# Patient Record
Sex: Female | Born: 1980 | Race: Black or African American | Hispanic: No | Marital: Single | Smoking: Never smoker
Health system: Southern US, Community
[De-identification: ages and names within clinical notes are randomized; demographics above are authoritative.]

## PROBLEM LIST (undated history)

## (undated) DIAGNOSIS — F329 Major depressive disorder, single episode, unspecified: Secondary | ICD-10-CM

## (undated) DIAGNOSIS — F32A Depression, unspecified: Secondary | ICD-10-CM

## (undated) DIAGNOSIS — F319 Bipolar disorder, unspecified: Secondary | ICD-10-CM

## (undated) DIAGNOSIS — E079 Disorder of thyroid, unspecified: Secondary | ICD-10-CM

## (undated) DIAGNOSIS — IMO0002 Reserved for concepts with insufficient information to code with codable children: Secondary | ICD-10-CM

---

## 2011-11-13 ENCOUNTER — Emergency Department (HOSPITAL_COMMUNITY)
Admission: EM | Admit: 2011-11-13 | Discharge: 2011-11-13 | Disposition: A | Discharge status: Home / Self Care | Payer: Medicare Other | Attending: Emergency Medicine | Admitting: Emergency Medicine

## 2011-11-13 ENCOUNTER — Emergency Department (HOSPITAL_COMMUNITY): Payer: Medicare Other

## 2011-11-13 ENCOUNTER — Encounter (HOSPITAL_COMMUNITY): Payer: Self-pay

## 2011-11-13 DIAGNOSIS — E079 Disorder of thyroid, unspecified: Secondary | ICD-10-CM | POA: Insufficient documentation

## 2011-11-13 DIAGNOSIS — Z79899 Other long term (current) drug therapy: Secondary | ICD-10-CM | POA: Insufficient documentation

## 2011-11-13 DIAGNOSIS — R202 Paresthesia of skin: Secondary | ICD-10-CM

## 2011-11-13 DIAGNOSIS — F3289 Other specified depressive episodes: Secondary | ICD-10-CM | POA: Insufficient documentation

## 2011-11-13 DIAGNOSIS — F329 Major depressive disorder, single episode, unspecified: Secondary | ICD-10-CM | POA: Insufficient documentation

## 2011-11-13 DIAGNOSIS — R209 Unspecified disturbances of skin sensation: Secondary | ICD-10-CM | POA: Insufficient documentation

## 2011-11-13 DIAGNOSIS — R51 Headache: Secondary | ICD-10-CM

## 2011-11-13 DIAGNOSIS — F319 Bipolar disorder, unspecified: Secondary | ICD-10-CM | POA: Insufficient documentation

## 2011-11-13 HISTORY — DX: Major depressive disorder, single episode, unspecified: F32.9

## 2011-11-13 HISTORY — DX: Depression, unspecified: F32.A

## 2011-11-13 HISTORY — DX: Bipolar disorder, unspecified: F31.9

## 2011-11-13 HISTORY — DX: Disorder of thyroid, unspecified: E07.9

## 2011-11-13 LAB — CBC
HCT: 34.6 % — ABNORMAL LOW (ref 36.0–46.0)
Hemoglobin: 11.6 g/dL — ABNORMAL LOW (ref 12.0–15.0)
MCH: 29.8 pg (ref 26.0–34.0)
MCHC: 33.5 g/dL (ref 30.0–36.0)
MCV: 88.9 fL (ref 78.0–100.0)
RBC: 3.89 MIL/uL (ref 3.87–5.11)

## 2011-11-13 LAB — COMPREHENSIVE METABOLIC PANEL
ALT: 12 U/L (ref 0–35)
AST: 17 U/L (ref 0–37)
CO2: 25 mEq/L (ref 19–32)
Chloride: 100 mEq/L (ref 96–112)
Creatinine, Ser: 0.86 mg/dL (ref 0.50–1.10)
GFR calc Af Amer: >90 mL/min (ref >90)
GFR calc non Af Amer: 90 mL/min — ABNORMAL LOW (ref >90)
Glucose, Bld: 97 mg/dL (ref 70–99)
Total Bilirubin: 0.3 mg/dL (ref 0.3–1.2)

## 2011-11-13 LAB — PREGNANCY, URINE: Preg Test, Ur: NEGATIVE

## 2011-11-13 MED ORDER — TRAMADOL HCL 50 MG PO TABS: 50.0000 mg | ORAL_TABLET | Freq: Four times a day (QID) | ORAL | Status: DC | PRN

## 2011-11-13 NOTE — ED Notes (Signed)
Dr. Steinl at bedside 

## 2011-11-13 NOTE — ED Provider Notes (Signed)
History     CSN: [id number]  Arrival date & time 11/13/11  0802   First MD Initiated Contact with Patient 11/13/11 [phone]      Chief Complaint  Patient presents with  . Numbness    (Consider location/radiation/quality/duration/timing/severity/associated sxs/prior treatment) The history is provided by the patient.  pt c/o numbness/tingling bil feet intermittently for past 1-2 weeks. Also states intermittent frontal headache in past 1-2 weeks. Daily headache. Gradual onset. Not related to certain time of day or activity. No change whether upright or supine. Dull. Frontal. Mild-moderate. No eye pain or change in vision. No numbness/weakness. No neck pain or stiffness. Denies sinus congestion or drainage. No uri c/o. No recent head injury or fall. No problems w balance or coordination. No unilateral numbness or weakness. Denies recent change in meds x hctz. States saw pcp w same, and had her thyroid tests rechecked in past week - was told normal, that she was on the appropriate dose of her synthroid.     Past Medical History  Diagnosis Date  . Thyroid disease   . Depression   . Bipolar 1 disorder     History reviewed. No pertinent past surgical history.  No family history on file.  History  Substance Use Topics  . Smoking status: Never Smoker   . Smokeless tobacco: Never Used  . Alcohol Use: No    OB History    Grav Para Term Preterm Abortions TAB SAB Ect Mult Living                  Review of Systems  Constitutional: Negative for fever and chills.  HENT: Negative for congestion, rhinorrhea, neck pain and neck stiffness.   Eyes: Negative for pain and visual disturbance.  Respiratory: Negative for cough and shortness of breath.   Cardiovascular: Negative for chest pain.  Gastrointestinal: Negative for vomiting, abdominal pain and diarrhea.  Genitourinary: Negative for dysuria and flank pain.  Musculoskeletal: Negative for back pain.  Skin: Negative for rash.    Neurological: Positive for headaches.  Hematological: Does not bruise/bleed easily.  Psychiatric/Behavioral: Negative for confusion.    Allergies  Review of patient's allergies indicates no known allergies.  Home Medications   Current Outpatient Rx  Name Route Sig Dispense Refill  . ARIPIPRAZOLE 10 MG PO TABS Oral Take 5 mg by mouth daily.    Marland Kitchen DIVALPROEX SODIUM ER 250 MG PO TB24 Oral Take 1,250 mg by mouth daily.    Marland Kitchen HYDROCHLOROTHIAZIDE 12.5 MG PO CAPS Oral Take 12.5 mg by mouth daily.    . IBUPROFEN 200 MG PO TABS Oral Take 400 mg by mouth every 8 (eight) hours as needed. For pain.    Marland Kitchen LEVOTHYROXINE SODIUM 75 MCG PO TABS Oral Take 75 mcg by mouth daily.    . NORELGESTROMIN-ETH ESTRADIOL 150-20 MCG/24HR TD PTWK Transdermal Place 1 patch onto the skin once a week. Switched out on Saturdays.      BP 140/84  Pulse 95  Temp 97.8 F (36.6 C) (Oral)  Resp 18  SpO2 100%  LMP 10/29/2011  Physical Exam  Nursing note and vitals reviewed. Constitutional: She is oriented to person, place, and time. She appears well-developed and well-nourished. No distress.  HENT:  Head: Atraumatic.  Nose: Nose normal.  Mouth/Throat: Oropharynx is clear and moist.       No sinus or temporal tenderness.  Eyes: Conjunctivae normal and EOM are normal. Pupils are equal, round, and reactive to light. No scleral icterus.  Fundus exam unremarkable.   Neck: Neck supple. No tracheal deviation present. No thyromegaly present.       No stiffness or rigidity.   Cardiovascular: Normal rate, regular rhythm, normal heart sounds and intact distal pulses.  Exam reveals no gallop and no friction rub.   No murmur heard. Pulmonary/Chest: Effort normal and breath sounds normal. No respiratory distress.  Abdominal: Soft. Normal appearance and bowel sounds are normal. She exhibits no distension. There is no tenderness.  Genitourinary:       No cva tenderness.  Musculoskeletal: Normal range of motion. She  exhibits no edema and no tenderness.  Neurological: She is alert and oriented to person, place, and time. No cranial nerve deficit.       Motor intact bilaterally. Steady gait.   Skin: Skin is warm and dry. No rash noted. She is not diaphoretic.  Psychiatric: She has a normal mood and affect.    ED Course  Procedures (including critical care time)   Labs Reviewed  CBC  BASIC METABOLIC PANEL  PREGNANCY, URINE   Results for orders placed during the hospital encounter of 11/13/11  CBC      Component Value Range   WBC 8.3  4.0 - 10.5 K/uL   RBC 3.89  3.87 - 5.11 MIL/uL   Hemoglobin 11.6 (*) 12.0 - 15.0 g/dL   HCT 16.1 (*) 09.6 - 04.5 %   MCV 88.9  78.0 - 100.0 fL   MCH 29.8  26.0 - 34.0 pg   MCHC 33.5  30.0 - 36.0 g/dL   RDW 40.9  81.1 - 91.4 %   Platelets 324  150 - 400 K/uL  PREGNANCY, URINE      Component Value Range   Preg Test, Ur NEGATIVE  NEGATIVE  COMPREHENSIVE METABOLIC PANEL      Component Value Range   Sodium 135  135 - 145 mEq/L   Potassium 4.2  3.5 - 5.1 mEq/L   Chloride 100  96 - 112 mEq/L   CO2 25  19 - 32 mEq/L   Glucose, Bld 97  70 - 99 mg/dL   BUN 6  6 - 23 mg/dL   Creatinine, Ser 7.82  0.50 - 1.10 mg/dL   Calcium 9.3  8.4 - 95.6 mg/dL   Total Protein 6.7  6.0 - 8.3 g/dL   Albumin 2.9 (*) 3.5 - 5.2 g/dL   AST 17  0 - 37 U/L   ALT 12  0 - 35 U/L   Alkaline Phosphatase 48  39 - 117 U/L   Total Bilirubin 0.3  0.3 - 1.2 mg/dL   GFR calc non Af Amer 90 (*) >90 mL/min   GFR calc Af Amer >90  >90 mL/min   Ct Head Wo Contrast  11/13/2011  *RADIOLOGY REPORT*  Clinical Data: Headache.  CT HEAD WITHOUT CONTRAST  Technique:  Contiguous axial images were obtained from the base of the skull through the vertex without contrast.  Comparison: None.  Findings: Bony calvarium is intact.  No mass effect or midline shift is noted.  Ventricular size is within normal limits.  There is no evidence of mass lesion, hemorrhage or acute infarction.  IMPRESSION: No gross  intracranial abnormality seen.   Original Report Authenticated By: Venita Sheffield., M.D.       MDM  Labs. Ct.   Discussed labs w pt. Ct neg acute.  Will give rx for home for headache. Pt states pcp has set up neurology eval/f/u in the next  1-2 weeks, encouraged to keep pcp and neuro follow up.         Suzi Roots, MD 11/13/11 [phone]

## 2011-11-13 NOTE — ED Notes (Signed)
Patient transported to CT 

## 2011-11-13 NOTE — ED Notes (Signed)
Patient c/o having numbness of bilateral feet and a frontal headache. Right> left. Patient reports that she was seen by her PCP and did not find anything wrong.

## 2016-03-15 ENCOUNTER — Emergency Department (HOSPITAL_COMMUNITY)
Admission: EM | Admit: 2016-03-15 | Discharge: 2016-03-15 | Disposition: A | Discharge status: Home / Self Care | Payer: Medicare Other | Attending: Emergency Medicine | Admitting: Emergency Medicine

## 2016-03-15 ENCOUNTER — Encounter (HOSPITAL_COMMUNITY): Payer: Self-pay | Admitting: Emergency Medicine

## 2016-03-15 DIAGNOSIS — Z79899 Other long term (current) drug therapy: Secondary | ICD-10-CM | POA: Insufficient documentation

## 2016-03-15 DIAGNOSIS — G894 Chronic pain syndrome: Secondary | ICD-10-CM

## 2016-03-15 DIAGNOSIS — M25572 Pain in left ankle and joints of left foot: Secondary | ICD-10-CM | POA: Diagnosis present

## 2016-03-15 MED ORDER — HYDROMORPHONE HCL 1 MG/ML IJ SOLN: 2.0000 mg | Freq: Once | INTRAMUSCULAR | Status: DC

## 2016-03-15 MED ORDER — KETOROLAC TROMETHAMINE 60 MG/2ML IM SOLN
60.0000 mg | Freq: Once | INTRAMUSCULAR | Status: AC
  Administered 2016-03-15: 60 mg via INTRAMUSCULAR
  Filled 2016-03-15: qty 2

## 2016-03-15 NOTE — ED Triage Notes (Signed)
Pt c/o generalized burning, "on fire all over," bilateral foot pain, burning, and swelling, right foot throbbing, SOB worse when laying. Edema to feet has been progressive over past 7 months. Generalized pruritus. Palms of hands are red and dry. All symptoms started in August after stubbing and breaking left toes. 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 Also diagnosed with influenza on February 9th, had initially felt better but now hoarse and coughing, feels "cold inside." No aching. Bilateral lower extremities with marked nonpitting edema.

## 2016-03-15 NOTE — ED Provider Notes (Signed)
[facility]-EMERGENCY DEPT Provider Note   CSN: [id number] Arrival date & time: 03/15/16  1006     History   Chief Complaint Chief Complaint  Patient presents with  . Leg Swelling    multiple complaints  . Foot Pain    HPI Sophia Shepard is a 36 y.o. female.  HPI Patient reports that she injured her left foot in August 2017 breaking to toes.  She states since then she's had chronic ongoing or worsening generalized pain to the left foot and left ankle.  She states it feels like fire.  She has been diagnosed with complex regional pain syndrome.  She states that the medications prescribed by both her chronic pain specialist and by the neurologist are not helping.  She no longer has a relationship with the pain specialist.  She's never been prescribed opiate medications.  She states that she is been on Neurontin and is taking this without improvement in her symptoms.  She states she does not know what else to do and does not know where else to turn.  She is requesting assistance with pain control of her left foot and leg.  Noted fevers or chills.  No new swelling.  No erythema.   Past Medical History:  Diagnosis Date  . Bipolar 1 disorder (HCC)   . Depression   . Thyroid disease     There are no active problems to display for this patient.   History reviewed. No pertinent surgical history.  OB History    No data available       Home Medications    Prior to Admission medications   Medication Sig Start Date End Date Taking? Authorizing Provider  acetaminophen (TYLENOL) 500 MG tablet Take 500-1,000 mg by mouth every 4 (four) hours as needed for mild pain, moderate pain, fever or headache.   Yes Historical Provider, MD  Cyanocobalamin (VITAMIN B12 PO) Take 5,000 mcg by mouth every Saturday.   Yes Historical Provider, MD  divalproex (DEPAKOTE ER) 250 MG 24 hr tablet Take 1,000 mg by mouth at bedtime. *Brand Name*   Yes Historical Provider, MD  ibuprofen (ADVIL,MOTRIN) 200 MG tablet  Take 400 mg by mouth every 4 (four) hours as needed for fever, headache, mild pain, moderate pain or cramping. For pain.    Yes Historical Provider, MD  levothyroxine (SYNTHROID, LEVOTHROID) 75 MCG tablet Take 75 mcg by mouth daily before breakfast.    Yes Historical Provider, MD  norelgestromin-ethinyl estradiol Burr Medico(XULANE) 150-35 MCG/24HR transdermal patch Place 1 patch onto the skin every Saturday. Puts on once a weekly every week of month except 3rd week is off   Yes Historical Provider, MD  Vitamin D, Ergocalciferol, (DRISDOL) 50000 units CAPS capsule Take 1 capsule by mouth every Thursday. 01/19/16  Yes Historical Provider, MD    Family History History reviewed. No pertinent family history.  Social History Social History  Substance Use Topics  . Smoking status: Never Smoker  . Smokeless tobacco: Never Used  . Alcohol use No     Allergies   Benadryl [diphenhydramine hcl (sleep)]; Gabapentin; and Topiramate   Review of Systems Review of Systems  All other systems reviewed and are negative.    Physical Exam Updated Vital Signs BP 128/90 (BP Location: Left Arm)   Pulse 85   Temp 98.2 F (36.8 C) (Oral)   Resp 18   SpO2 99%   Physical Exam  Constitutional: She is oriented to person, place, and time. She appears well-developed and well-nourished.  HENT:  Head: Normocephalic.  Eyes: EOM are normal.  Neck: Normal range of motion.  Pulmonary/Chest: Effort normal.  Abdominal: She exhibits no distension.  Musculoskeletal: Normal range of motion.  Normal pulses in left foot normal range of motion left ankle.  Left foot is perfused.  She wiggles her toes bilaterally.  +1 edema bilaterally.  No unilateral leg swelling as compared to the other  Neurological: She is alert and oriented to person, place, and time.  Psychiatric: She has a normal mood and affect.  Nursing note and vitals reviewed.    ED Treatments / Results  Labs (all labs ordered are listed, but only abnormal  results are displayed) Labs Reviewed - No data to display  EKG  EKG Interpretation None       Radiology No results found.  Procedures Procedures (including critical care time)  Medications Ordered in ED Medications  HYDROmorphone (DILAUDID) injection 2 mg (2 mg Intramuscular Refused 03/15/16 1235)  ketorolac (TORADOL) injection 60 mg (60 mg Intramuscular Given 03/15/16 1159)     Initial Impression / Assessment and Plan / ED Course  I have reviewed the triage vital signs and the nursing notes.  Pertinent labs & imaging results that were available during my care of the patient were reviewed by me and considered in my medical decision making (see chart for details).     The patient has a history consistent with complex regional pain syndrome.  I offered her opiate medication here in emergency department to control her pain but she does not want this.  I do not have any other additional recommendations at this time except to follow-up with her chronic pain specialist.  With a long discussion regarding the benefits and expertise of a pain specialist in this type of illness.  All questions answered.  Primary care, neurology, pain clinic follow-up.  No limb or  life-threatening illness present  Final Clinical Impressions(s) / ED Diagnoses   Final diagnoses:  Chronic pain syndrome    New Prescriptions New Prescriptions   No medications on file     Azalia Bilis, MD 03/15/16 1316

## 2016-03-15 NOTE — Discharge Instructions (Signed)
Please call a chronic pain specialist for further evaluation and management of your chronic pain syndrome

## 2016-07-01 ENCOUNTER — Emergency Department (INDEPENDENT_AMBULATORY_CARE_PROVIDER_SITE_OTHER)
Admission: EM | Admit: 2016-07-01 | Discharge: 2016-07-01 | Disposition: A | Discharge status: Home / Self Care | Payer: Medicare Other | Source: Home / Self Care | Attending: Family Medicine | Admitting: Family Medicine

## 2016-07-01 ENCOUNTER — Emergency Department (HOSPITAL_BASED_OUTPATIENT_CLINIC_OR_DEPARTMENT_OTHER): Payer: Medicare Other

## 2016-07-01 ENCOUNTER — Emergency Department (HOSPITAL_BASED_OUTPATIENT_CLINIC_OR_DEPARTMENT_OTHER)
Admission: EM | Admit: 2016-07-01 | Discharge: 2016-07-01 | Disposition: A | Discharge status: Home / Self Care | Payer: Medicare Other | Attending: Emergency Medicine | Admitting: Emergency Medicine

## 2016-07-01 ENCOUNTER — Encounter: Payer: Self-pay | Admitting: Emergency Medicine

## 2016-07-01 ENCOUNTER — Encounter (HOSPITAL_BASED_OUTPATIENT_CLINIC_OR_DEPARTMENT_OTHER): Payer: Self-pay | Admitting: *Deleted

## 2016-07-01 DIAGNOSIS — Z7983 Long term (current) use of bisphosphonates: Secondary | ICD-10-CM | POA: Diagnosis not present

## 2016-07-01 DIAGNOSIS — M79605 Pain in left leg: Secondary | ICD-10-CM

## 2016-07-01 DIAGNOSIS — Z79899 Other long term (current) drug therapy: Secondary | ICD-10-CM | POA: Diagnosis not present

## 2016-07-01 DIAGNOSIS — M79604 Pain in right leg: Secondary | ICD-10-CM

## 2016-07-01 DIAGNOSIS — M7989 Other specified soft tissue disorders: Secondary | ICD-10-CM

## 2016-07-01 DIAGNOSIS — G8929 Other chronic pain: Secondary | ICD-10-CM

## 2016-07-01 DIAGNOSIS — R600 Localized edema: Secondary | ICD-10-CM | POA: Diagnosis not present

## 2016-07-01 DIAGNOSIS — R609 Edema, unspecified: Secondary | ICD-10-CM

## 2016-07-01 DIAGNOSIS — M25572 Pain in left ankle and joints of left foot: Secondary | ICD-10-CM | POA: Diagnosis present

## 2016-07-01 HISTORY — DX: Reserved for concepts with insufficient information to code with codable children: IMO0002

## 2016-07-01 NOTE — ED Provider Notes (Signed)
CSN: [id number]     Arrival date & time 07/01/16  1656 History   First MD Initiated Contact with Patient 07/01/16 1718     Chief Complaint  Patient presents with  . Leg Pain   (Consider location/radiation/quality/duration/timing/severity/associated sxs/prior Treatment) HPI  Sophia Shepard is a 37 y.o. female presenting to UC with hx of chronic regional pain syndrome c/o significantly worsening pain and swelling in both legs since yesterday.  Pain is 10/10.  She is being followed by pain management and does take Naproxen but no relief.  She has been having worsening muscle spasms.  She did take a car ride to [employer], about 1 hour away, the day before symptoms worsened but no other changes she can recall. No known injuries.  Denies chest pain or SOB.  No hx of clots, however she feels a bump on her anterior Right thigh and behind her Left knee. She is concerned she may have a clot.  The last time she had an ultrasound of her legs was in almost 1 year ago.  She has not been on muscle relaxers in "a long time" but willing to try.    Past Medical History:  Diagnosis Date  . Bipolar 1 disorder (HCC)   . Complex regional pain syndrome   . Depression   . Thyroid disease    History reviewed. No pertinent surgical history. History reviewed. No pertinent family history. Social History  Substance Use Topics  . Smoking status: Never Smoker  . Smokeless tobacco: Never Used  . Alcohol use No   OB History    Gravida Para Term Preterm AB Living   1             SAB TAB Ectopic Multiple Live Births                 Review of Systems  Musculoskeletal: Positive for arthralgias, gait problem, joint swelling and myalgias.  Skin: Negative for color change and wound.  Neurological: Positive for numbness (tingling in legs (chronic)). Negative for weakness.    Allergies  Benadryl [diphenhydramine hcl (sleep)]; Gabapentin; and Topiramate  Home Medications   Prior to Admission medications    Medication Sig Start Date End Date Taking? Authorizing Provider  acetaminophen (TYLENOL) 500 MG tablet Take 500-1,000 mg by mouth every 4 (four) hours as needed for mild pain, moderate pain, fever or headache.    [provider]  Cyanocobalamin (VITAMIN B12 PO) Take 5,000 mcg by mouth every Saturday.    [provider]  divalproex (DEPAKOTE ER) 250 MG 24 hr tablet Take 1,000 mg by mouth at bedtime. *Brand Name*    [provider]  ibuprofen (ADVIL,MOTRIN) 200 MG tablet Take 400 mg by mouth every 4 (four) hours as needed for fever, headache, mild pain, moderate pain or cramping. For pain.     [provider]  levothyroxine (SYNTHROID, LEVOTHROID) 75 MCG tablet Take 75 mcg by mouth daily before breakfast.     [provider]  norelgestromin-ethinyl estradiol Burr Medico) 150-35 MCG/24HR transdermal patch Place 1 patch onto the skin every Saturday. Puts on once a weekly every week of month except 3rd week is off    [provider]  Vitamin D, Ergocalciferol, (DRISDOL) 50000 units CAPS capsule Take 1 capsule by mouth every Thursday. 01/19/16   [provider]   Meds Ordered and Administered this Visit  Medications - No data to display  BP 132/87 (BP Location: Left Arm)   Pulse 84   Temp 97.6  F (36.4 C) (Oral)   Resp 18   Ht 5\' 6"  (1.676 m)   Wt 214 lb (97.1 kg)   LMP 06/20/2016   SpO2 99%   BMI 34.54 kg/m  No data found.   Physical Exam  Constitutional: She is oriented to person, place, and time. She appears well-developed and well-nourished. No distress.  HENT:  Head: Normocephalic and atraumatic.  Neck: Normal range of motion.  Cardiovascular: Normal rate and regular rhythm.   Pulses:      Dorsalis pedis pulses are 2+ on the right side, and 2+ on the left side.  Pulmonary/Chest: Effort normal and breath sounds normal. No respiratory distress. She has no wheezes. She has no rales.  Musculoskeletal: Normal range of motion. She  exhibits edema and tenderness.  Bilateral leg edema, 2+ pitting edema. Tenderness to bilateral legs. Worse behind Left knee and over anterior Right thigh. Full ROM knees and ankles.   Neurological: She is alert and oriented to person, place, and time.  Skin: Skin is warm and dry. Capillary refill takes less than 2 seconds. No rash noted. She is not diaphoretic. No erythema.  Psychiatric: She has a normal mood and affect. Her behavior is normal.  Nursing note and vitals reviewed.   Urgent Care Course     Procedures (including critical care time)  Labs Review Labs Reviewed - No data to display  Imaging Review US Venous Img Lower Bilateral  Result Date: 07/01/2016 CLINICAL DATA:  36 year old female with bilateral lower extremity edema and pain. EXAM: BILATERAL LOWER EXTREMITY VENOUS DOPPLER ULTRASOUND TECHNIQUE: Gray-scale sonography with graded compression, as well as color Doppler and duplex ultrasound were performed to evaluate the lower extremity deep venous systems from the level of the common femoral vein and including the common femoral, femoral, profunda femoral, popliteal and calf veins including the posterior tibial, peroneal and gastrocnemius veins when visible. The superficial great saphenous vein was also interrogated. Spectral Doppler was utilized to evaluate flow at rest and with distal augmentation maneuvers in the common femoral, femoral and popliteal veins. COMPARISON:  None. FINDINGS: Evaluation is limited due to patient's body habitus. RIGHT LOWER EXTREMITY Common Femoral Vein: No evidence of thrombus. Normal compressibility, respiratory phasicity and response to augmentation. Saphenofemoral Junction: No evidence of thrombus. Normal compressibility and flow on color Doppler imaging. Profunda Femoral Vein: No evidence of thrombus. Normal compressibility and flow on color Doppler imaging. Femoral Vein: No evidence of thrombus. Normal compressibility, respiratory phasicity and  response to augmentation. Popliteal Vein: No evidence of thrombus. Normal compressibility, respiratory phasicity and response to augmentation. Calf Veins: No evidence of thrombus as visualized. Superficial Great Saphenous Vein: No evidence of thrombus. Normal compressibility and flow on color Doppler imaging. Venous Reflux:  None. Other Findings:  None. LEFT LOWER EXTREMITY Common Femoral Vein: No evidence of thrombus. Normal compressibility, respiratory phasicity and response to augmentation. Saphenofemoral Junction: No evidence of thrombus. Normal compressibility and flow on color Doppler imaging. Profunda Femoral Vein: No evidence of thrombus. Normal compressibility and flow on color Doppler imaging. Femoral Vein: No evidence of thrombus. Normal compressibility, respiratory phasicity and response to augmentation. Popliteal Vein: No evidence of thrombus. Normal compressibility, respiratory phasicity and response to augmentation. Calf Veins: No evidence of thrombus as visualized. Superficial Great Saphenous Vein: No evidence of thrombus. Normal compressibility and flow on color Doppler imaging. Venous Reflux:  None. Other Findings:  None. IMPRESSION: No evidence of DVT within either lower extremity. Electronically Signed   By: Ceasar Mons.D.  On: 07/01/2016 20:38       MDM   1. Leg swelling   2. Chronic pain of both lower extremities    Pt c/o bilateral leg swelling and pain, chronic but worse since yesterday.  [facility] and pain control Pt agreeable.     Lurene Shadowhelps, Princess Karn[place] O, [place] 07/02/16 1135

## 2016-07-01 NOTE — ED Notes (Signed)
Pt is in U/S.

## 2016-07-01 NOTE — ED Triage Notes (Signed)
History of Chronic Regional pian syndrome

## 2016-07-01 NOTE — ED Triage Notes (Signed)
Pt has chronic regional pain syndrome.  States pain since august with swelling in her legs.  Reports worsening swelling and 'knots' in right thigh and behind left knee.  Denies long trips or sedentary lifestyle.

## 2016-07-01 NOTE — ED Provider Notes (Signed)
[facility] Provider Note   CSN: [id number] Arrival date & time: 07/01/16  1835   By signing my name below, I, Sophia Shepard, attest that this documentation has been prepared under the direction and in the presence of [name], [name], MD. Electronically signed, Sophia Shepard, ED Scribe. 07/01/16. 8:38 PM.   History   Chief Complaint Chief Complaint  Patient presents with  . Leg Swelling   The history is provided by the patient and medical records. No language interpreter was used.    Sophia Sophia Shepard is a 36 y.o. female with h/o complex regional pain syndrom presenting to the Emergency Department with chief complaint of acute on chronic L ankle pain since yesterday. Pain chronic x ~11 months. She also notes "knots" in the R thigh. Burning, sharp, constant 10/10 pain described. Fracture noted to L sided toes in 03/2016 after hitting the toes on a dresser. [facility] from [facility] in [place] to R/O DVT. No long distance travel or sedentary lifestyle noted. No recent falls noted PCP noted for F/U.  Past Medical History:  Diagnosis Date  . Bipolar 1 disorder (HCC)   . Complex regional pain syndrome   . Depression   . Thyroid disease     There are no active problems to display for this patient.   History reviewed. No pertinent surgical history.  OB History    Gravida Para Term Preterm AB Living   1             SAB TAB Ectopic Multiple Live Births                   Home Medications    Prior to Admission medications   Medication Sig Start Date End Date Taking? Authorizing Provider  acetaminophen (TYLENOL) 500 MG tablet Take 500-1,000 mg by mouth every 4 (four) hours as needed for mild pain, moderate pain, fever or headache.    [provider]  Cyanocobalamin (VITAMIN B12 PO) Take 5,000 mcg by mouth every Saturday.    [provider]  divalproex (DEPAKOTE ER) 250 MG 24 hr tablet Take 1,000 mg by mouth at bedtime. *Brand Name*     [provider]  ibuprofen (ADVIL,MOTRIN) 200 MG tablet Take 400 mg by mouth every 4 (four) hours as needed for fever, headache, mild pain, moderate pain or cramping. For pain.     [provider]  levothyroxine (SYNTHROID, LEVOTHROID) 75 MCG tablet Take 75 mcg by mouth daily before breakfast.     [provider]  norelgestromin-ethinyl estradiol Burr Medico(XULANE) 150-35 MCG/24HR transdermal patch Place 1 patch onto the skin every Saturday. Puts on once a weekly every week of month except 3rd week is off    [provider]  Vitamin D, Ergocalciferol, (DRISDOL) 50000 units CAPS capsule Take 1 capsule by mouth every Thursday. 01/19/16   [provider]    Family History History reviewed. No pertinent family history.  Social History Social History  Substance Use Topics  . Smoking status: Never Smoker  . Smokeless tobacco: Never Used  . Alcohol use No     Allergies   Benadryl [diphenhydramine hcl (sleep)]; Gabapentin; and Topiramate   Review of Systems Review of Systems All other systems reviewed and all systems are negative for acute changes except as noted in the HPI and PMH.    Physical Exam Updated Vital Signs BP 140/84 (BP Location: Left Arm)   Pulse (!) 110   Temp 98.9 F (37.2 C) (  Oral)   Resp (!) 22   Ht 5\' 6"  (1.676 m)   Wt 214 lb (97.1 kg)   LMP 06/20/2016   SpO2 98%   Breastfeeding? Unknown   BMI 34.54 kg/m   Physical Exam  Constitutional: She is oriented to person, place, and time. She appears well-developed and well-nourished. No distress.  HENT:  Head: Normocephalic and atraumatic.  Right Ear: External ear normal.  Left Ear: External ear normal.  Nose: Nose normal.  Eyes: Conjunctivae and EOM are normal. No scleral icterus.  Neck: Normal range of motion and phonation normal.  Cardiovascular: Normal rate and regular rhythm.   Pulmonary/Chest: Effort normal. No stridor. No respiratory distress.  Abdominal: She exhibits no  distension.  Musculoskeletal: Normal range of motion. She exhibits no edema.  Pain at the popliteal region and pain when touching the gastrocnemius.  Neurological: She is alert and oriented to person, place, and time.  Skin: She is not diaphoretic.  Psychiatric: She has a normal mood and affect. Her behavior is normal.  Vitals reviewed.    ED Treatments / Results  DIAGNOSTIC STUDIES: Oxygen Saturation is 98% on RA, NL by my interpretation.    COORDINATION OF CARE: 8:31 PM-Discussed next steps with pt. Pt verbalized understanding and is agreeable with the plan. Pt prepared for d/c, advised of symptomatic care at home, F/U instructions and return precautions.    Labs (all labs ordered are listed, but only abnormal results are displayed) Labs Reviewed - No data to display  EKG  EKG Interpretation None       Radiology US Venous Img Lower Bilateral  Result Date: 07/01/2016 CLINICAL DATA:  36 year old female with bilateral lower extremity edema and pain. EXAM: BILATERAL LOWER EXTREMITY VENOUS DOPPLER ULTRASOUND TECHNIQUE: Gray-scale sonography with graded compression, as well as color Doppler and duplex ultrasound were performed to evaluate the lower extremity deep venous systems from the level of the common femoral vein and including the common femoral, femoral, profunda femoral, popliteal and calf veins including the posterior tibial, peroneal and gastrocnemius veins when visible. The superficial great saphenous vein was also interrogated. Spectral Doppler was utilized to evaluate flow at rest and with distal augmentation maneuvers in the common femoral, femoral and popliteal veins. COMPARISON:  None. FINDINGS: Evaluation is limited due to patient's body habitus. RIGHT LOWER EXTREMITY Common Femoral Vein: No evidence of thrombus. Normal compressibility, respiratory phasicity and response to augmentation. Saphenofemoral Junction: No evidence of thrombus. Normal compressibility and flow on  color Doppler imaging. Profunda Femoral Vein: No evidence of thrombus. Normal compressibility and flow on color Doppler imaging. Femoral Vein: No evidence of thrombus. Normal compressibility, respiratory phasicity and response to augmentation. Popliteal Vein: No evidence of thrombus. Normal compressibility, respiratory phasicity and response to augmentation. Calf Veins: No evidence of thrombus as visualized. Superficial Great Saphenous Vein: No evidence of thrombus. Normal compressibility and flow on color Doppler imaging. Venous Reflux:  None. Other Findings:  None. LEFT LOWER EXTREMITY Common Femoral Vein: No evidence of thrombus. Normal compressibility, respiratory phasicity and response to augmentation. Saphenofemoral Junction: No evidence of thrombus. Normal compressibility and flow on color Doppler imaging. Profunda Femoral Vein: No evidence of thrombus. Normal compressibility and flow on color Doppler imaging. Femoral Vein: No evidence of thrombus. Normal compressibility, respiratory phasicity and response to augmentation. Popliteal Vein: No evidence of thrombus. Normal compressibility, respiratory phasicity and response to augmentation. Calf Veins: No evidence of thrombus as visualized. Superficial Great Saphenous Vein: No evidence of thrombus. Normal compressibility and flow on color  Doppler imaging. Venous Reflux:  None. Other Findings:  None. IMPRESSION: No evidence of DVT within either lower extremity. Electronically Signed   By: Elgie Collard M.D.   On: 07/01/2016 20:38    Procedures Procedures (including critical care time)  Medications Ordered in ED Medications - No data to display   Initial Impression / Assessment and Plan / ED Course  I have reviewed the triage vital signs and the nursing notes.  Pertinent labs & imaging results that were available during my care of the patient were reviewed by me and considered in my medical decision making (see chart for details).      Ultrasound without evidence of DVT. No recent trauma warranting plain film. No evidence of cellulitis.  Recommended close follow-up with PCP  The patient is safe for discharge with strict return precautions.   Final Clinical Impressions(s) / ED Diagnoses   Final diagnoses:  Peripheral edema  Left leg pain   Disposition: Discharge  Condition: Good  I have discussed the results, Dx and Tx plan with the patient who expressed understanding and agree(s) with the plan. Discharge instructions discussed at great length. The patient was given strict return precautions who verbalized understanding of the instructions. No further questions at time of discharge.    New Prescriptions   No medications on file    Follow Up: Leta Baptist, PA-C [address] [place] [address] [phone]  Schedule an appointment as soon as possible for a visit  As needed   I personally performed the services described in this documentation, which was scribed in my presence. The recorded information has been reviewed and is accurate.        Nira Conn, MD 07/01/16 2137

## 2016-07-01 NOTE — ED Triage Notes (Signed)
Patient presents to [facility] with C/O pain and edema in bilateral legs, knees, ankles, feet,and joints, ongoing issue worse since yesterday, rates pain 10/10, spasms, sharp stabbing, pain in both legs radiates down.

## 2018-10-29 IMAGING — US US EXTREM LOW VENOUS BILAT
1 series · 13 of 24 positions shown · non-contrast
Comparison: None.

CLINICAL DATA: 36-year-old female with bilateral lower extremity
edema and pain.



[Series 1: us extrem low venous bilat · 0.08mm/px · 13 of 44 slices shown]
[im 1/44]
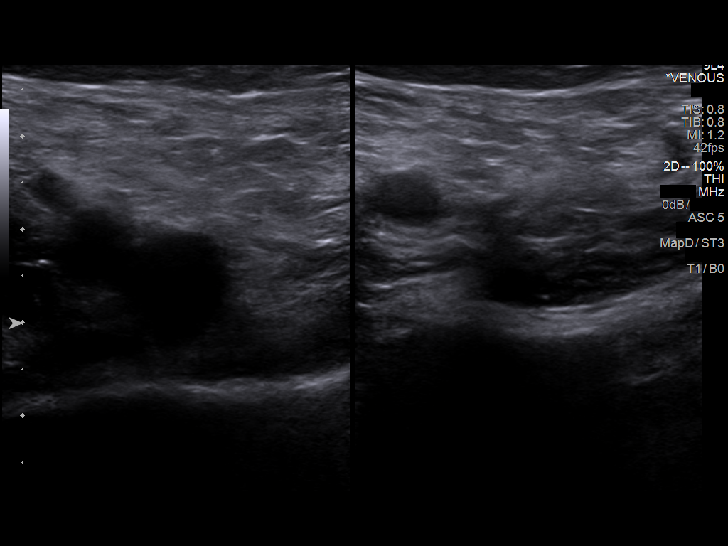
[im 4/44]
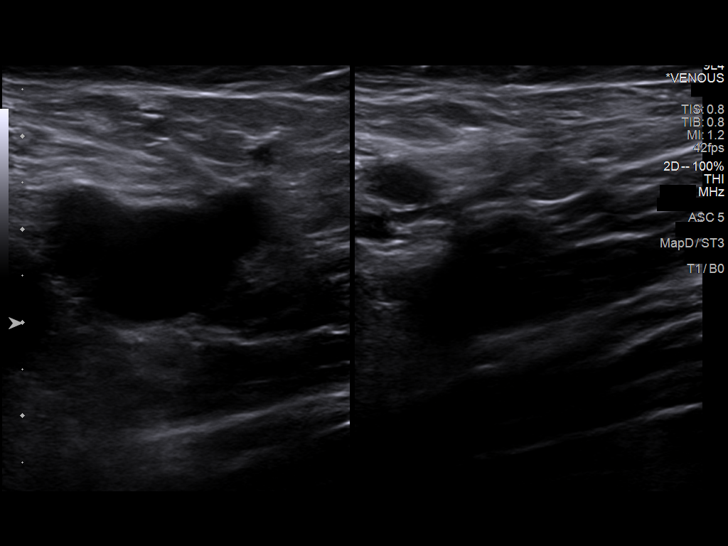
[im 8/44]
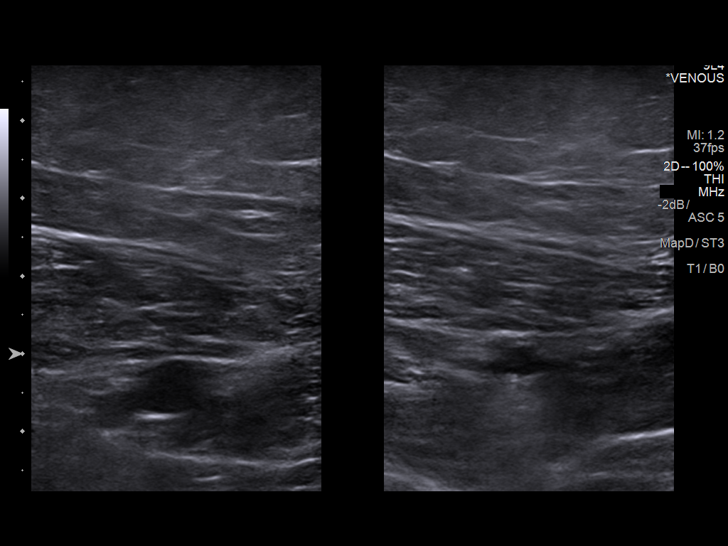
[im 12/44]
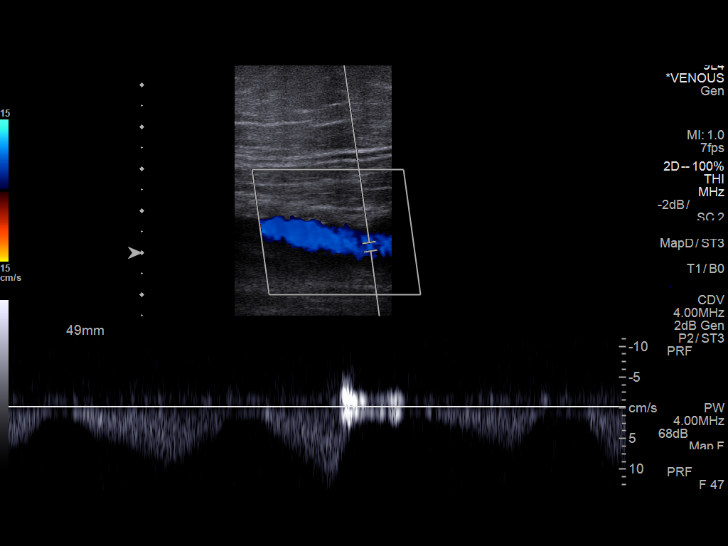
[im 15/44]
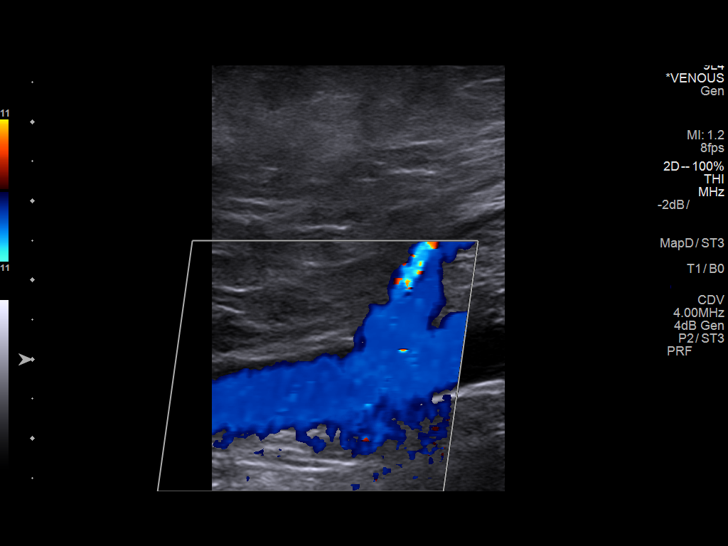
[im 19/44]
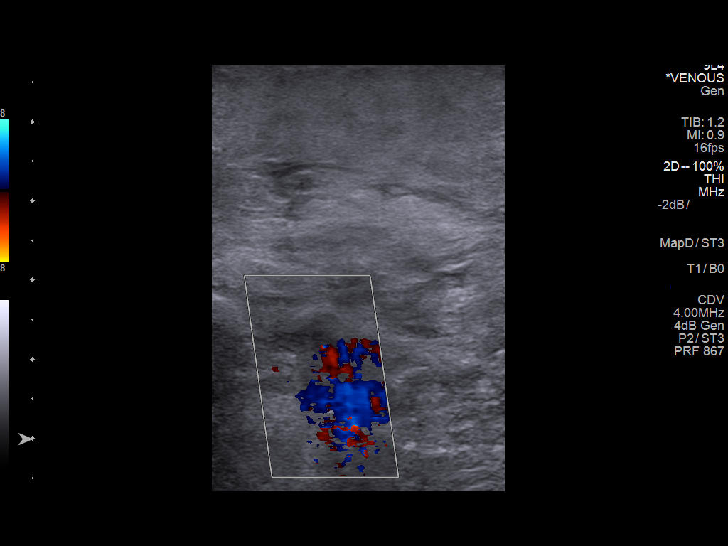
[im 23/44]
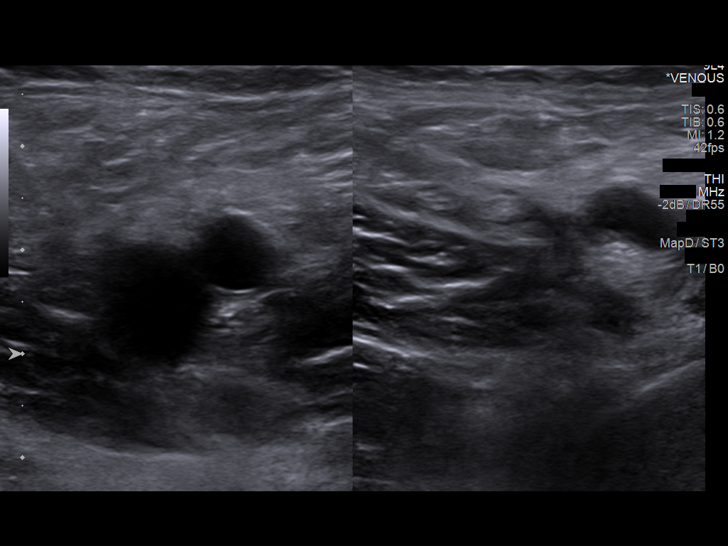
[im 25/44]
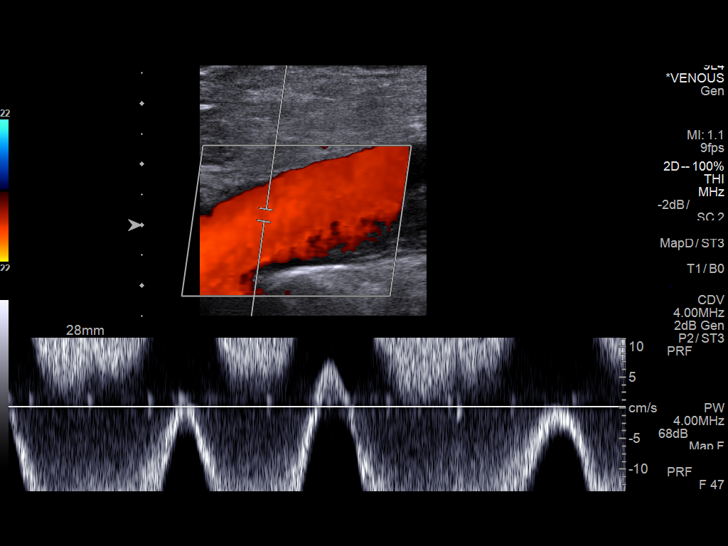
[im 29/44]
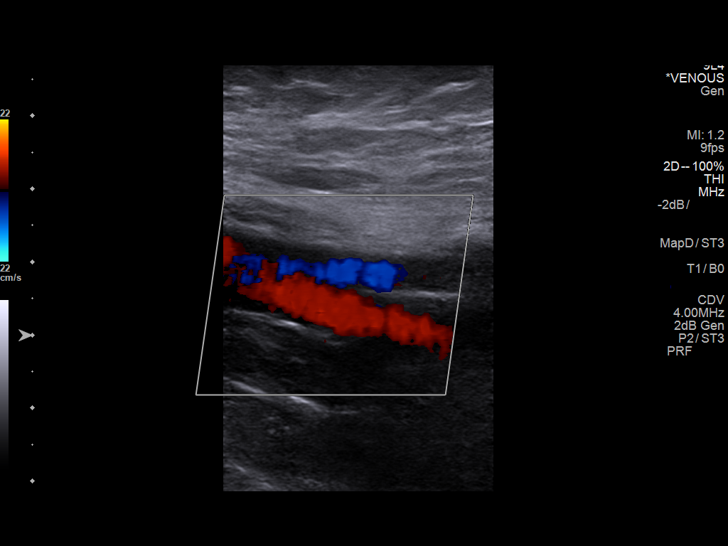
[im 32/44]
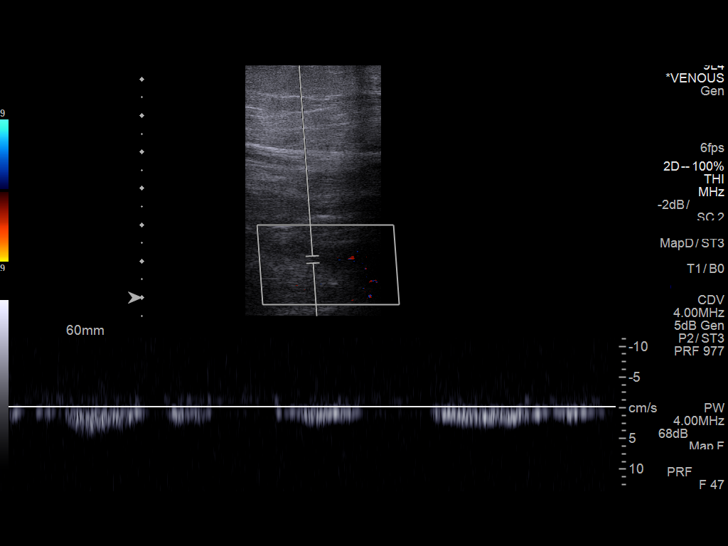
[im 36/44]
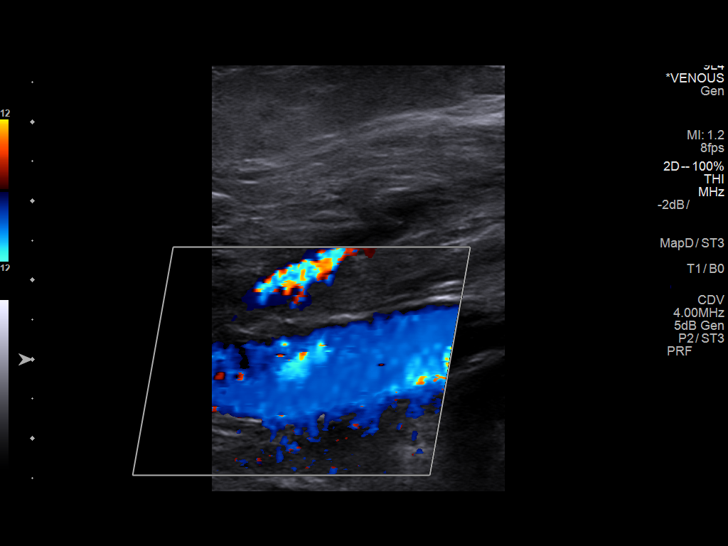
[im 40/44]
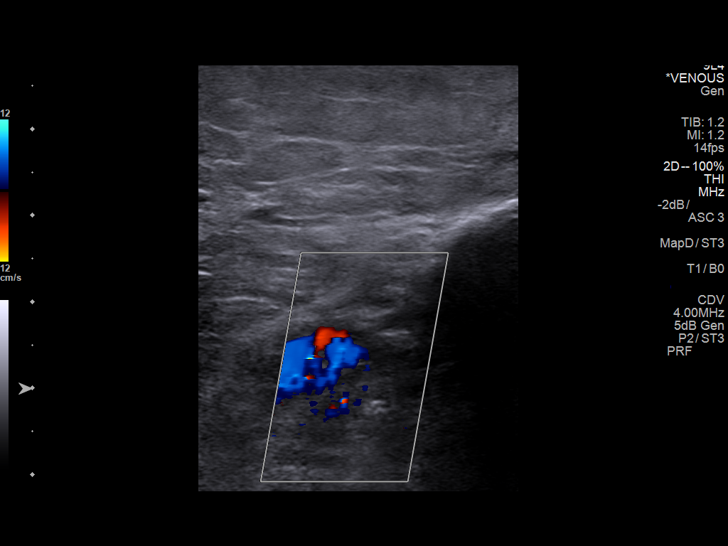
[im 44/44]
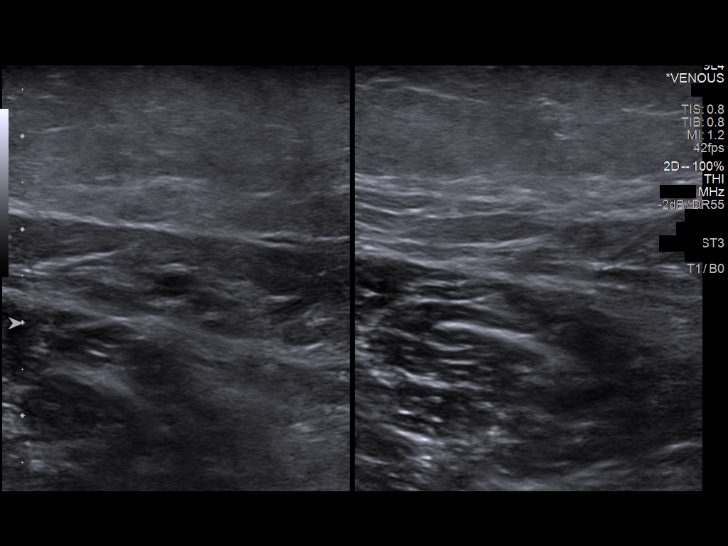

[13 of 24 positions shown; findings below may reference images not displayed]

FINDINGS: Evaluation is limited due to patient's body habitus.

RIGHT LOWER EXTREMITY

Common Femoral Vein: No evidence of thrombus. Normal
compressibility, respiratory phasicity and response to augmentation.

Saphenofemoral Junction: No evidence of thrombus. Normal
compressibility and flow on color Doppler imaging.

Profunda Femoral Vein: No evidence of thrombus. Normal
compressibility and flow on color Doppler imaging.

Femoral Vein: No evidence of thrombus. Normal compressibility,
respiratory phasicity and response to augmentation.

Popliteal Vein: No evidence of thrombus. Normal compressibility,
respiratory phasicity and response to augmentation.

Calf Veins: No evidence of thrombus as visualized.

Superficial Great Saphenous Vein: No evidence of thrombus. Normal
compressibility and flow on color Doppler imaging.

Venous Reflux:  None.

Other Findings:  None.

LEFT LOWER EXTREMITY

Common Femoral Vein: No evidence of thrombus. Normal
compressibility, respiratory phasicity and response to augmentation.

Saphenofemoral Junction: No evidence of thrombus. Normal
compressibility and flow on color Doppler imaging.

Profunda Femoral Vein: No evidence of thrombus. Normal
compressibility and flow on color Doppler imaging.

Femoral Vein: No evidence of thrombus. Normal compressibility,
respiratory phasicity and response to augmentation.

Popliteal Vein: No evidence of thrombus. Normal compressibility,
respiratory phasicity and response to augmentation.

Calf Veins: No evidence of thrombus as visualized.

Superficial Great Saphenous Vein: No evidence of thrombus. Normal
compressibility and flow on color Doppler imaging.

Venous Reflux:  None.

Other Findings:  None.
IMPRESSION: No evidence of DVT within either lower extremity.
